# Patient Record
Sex: Male | Born: 1995 | Race: Black or African American | Hispanic: No | Marital: Single | Smoking: Current every day smoker
Health system: Southern US, Community
[De-identification: ages and names within clinical notes are randomized; demographics above are authoritative.]

## PROBLEM LIST (undated history)

## (undated) DIAGNOSIS — J45909 Unspecified asthma, uncomplicated: Secondary | ICD-10-CM

## (undated) DIAGNOSIS — F909 Attention-deficit hyperactivity disorder, unspecified type: Secondary | ICD-10-CM

## (undated) HISTORY — DX: Unspecified asthma, uncomplicated: J45.909

## (undated) HISTORY — DX: Attention-deficit hyperactivity disorder, unspecified type: F90.9

## (undated) HISTORY — PX: INGUINAL HERNIA REPAIR: SUR1180

---
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
## 2017-09-20 ENCOUNTER — Emergency Department (HOSPITAL_COMMUNITY): Payer: Managed Care, Other (non HMO)

## 2017-09-20 ENCOUNTER — Encounter (HOSPITAL_COMMUNITY): Payer: Self-pay | Admitting: Emergency Medicine

## 2017-09-20 ENCOUNTER — Other Ambulatory Visit: Payer: Self-pay

## 2017-09-20 ENCOUNTER — Emergency Department (HOSPITAL_COMMUNITY)
Admission: EM | Admit: 2017-09-20 | Discharge: 2017-09-20 | Disposition: A | Discharge status: Home / Self Care | Payer: Managed Care, Other (non HMO) | Attending: Emergency Medicine | Admitting: Emergency Medicine

## 2017-09-20 DIAGNOSIS — M79605 Pain in left leg: Secondary | ICD-10-CM | POA: Insufficient documentation

## 2017-09-20 DIAGNOSIS — J45909 Unspecified asthma, uncomplicated: Secondary | ICD-10-CM | POA: Insufficient documentation

## 2017-09-20 DIAGNOSIS — F17200 Nicotine dependence, unspecified, uncomplicated: Secondary | ICD-10-CM | POA: Diagnosis not present

## 2017-09-20 DIAGNOSIS — M549 Dorsalgia, unspecified: Secondary | ICD-10-CM | POA: Insufficient documentation

## 2017-09-20 DIAGNOSIS — M79604 Pain in right leg: Secondary | ICD-10-CM | POA: Insufficient documentation

## 2017-09-20 DIAGNOSIS — M7918 Myalgia, other site: Secondary | ICD-10-CM

## 2017-09-20 MED ORDER — METHOCARBAMOL 500 MG PO TABS: 500.0000 mg | ORAL_TABLET | Freq: Three times a day (TID) | ORAL | 0 refills | Status: DC | PRN

## 2017-09-20 MED ORDER — METHOCARBAMOL 500 MG PO TABS
500.0000 mg | ORAL_TABLET | Freq: Three times a day (TID) | ORAL | 0 refills | Status: AC | PRN
Start: 2017-09-20 — End: ?

## 2017-09-20 MED ORDER — NAPROXEN 500 MG PO TABS: 500.0000 mg | ORAL_TABLET | Freq: Two times a day (BID) | ORAL | 0 refills | Status: DC | PRN

## 2017-09-20 MED ORDER — NAPROXEN 250 MG PO TABS
500.0000 mg | ORAL_TABLET | Freq: Once | ORAL | Status: AC
  Administered 2017-09-20: 500 mg via ORAL
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
## 2017-09-20 NOTE — ED Notes (Signed)
Patient transported to X-ray 

## 2017-09-20 NOTE — Discharge Instructions (Addendum)
It is normal for your symptoms to worsen over the next 2 to 3 days.  Alternate ice and heat to areas of injury 3-4 times per day to limit inflammation and spasm.  Avoid strenuous activity and heavy lifting.  We recommend consistent use of naproxen in addition to Robaxin for muscle spasms.  Do not drive or drink alcohol after taking Robaxin as it may make you drowsy and impair your judgment.  We recommend follow-up with a primary care doctor to ensure resolution of symptoms.  Return to the ED for any new or concerning symptoms.

## 2018-10-23 ENCOUNTER — Other Ambulatory Visit: Payer: Self-pay

## 2018-10-23 ENCOUNTER — Emergency Department (HOSPITAL_BASED_OUTPATIENT_CLINIC_OR_DEPARTMENT_OTHER): Payer: Self-pay

## 2018-10-23 ENCOUNTER — Emergency Department (HOSPITAL_BASED_OUTPATIENT_CLINIC_OR_DEPARTMENT_OTHER)
Admission: EM | Admit: 2018-10-23 | Discharge: 2018-10-23 | Disposition: A | Discharge status: Home / Self Care | Payer: Self-pay | Attending: Emergency Medicine | Admitting: Emergency Medicine

## 2018-10-23 ENCOUNTER — Encounter (HOSPITAL_BASED_OUTPATIENT_CLINIC_OR_DEPARTMENT_OTHER): Payer: Self-pay | Admitting: *Deleted

## 2018-10-23 DIAGNOSIS — S0511XA Contusion of eyeball and orbital tissues, right eye, initial encounter: Secondary | ICD-10-CM | POA: Insufficient documentation

## 2018-10-23 DIAGNOSIS — W1789XA Other fall from one level to another, initial encounter: Secondary | ICD-10-CM | POA: Insufficient documentation

## 2018-10-23 DIAGNOSIS — S022XXA Fracture of nasal bones, initial encounter for closed fracture: Secondary | ICD-10-CM | POA: Insufficient documentation

## 2018-10-23 DIAGNOSIS — Y999 Unspecified external cause status: Secondary | ICD-10-CM | POA: Insufficient documentation

## 2018-10-23 DIAGNOSIS — H1131 Conjunctival hemorrhage, right eye: Secondary | ICD-10-CM

## 2018-10-23 DIAGNOSIS — Y939 Activity, unspecified: Secondary | ICD-10-CM | POA: Insufficient documentation

## 2018-10-23 DIAGNOSIS — F17210 Nicotine dependence, cigarettes, uncomplicated: Secondary | ICD-10-CM | POA: Insufficient documentation

## 2018-10-23 DIAGNOSIS — Y929 Unspecified place or not applicable: Secondary | ICD-10-CM | POA: Insufficient documentation

## 2018-10-23 DIAGNOSIS — S93401A Sprain of unspecified ligament of right ankle, initial encounter: Secondary | ICD-10-CM | POA: Insufficient documentation

## 2018-10-23 DIAGNOSIS — J45909 Unspecified asthma, uncomplicated: Secondary | ICD-10-CM | POA: Insufficient documentation

## 2018-10-23 MED ORDER — NAPROXEN 500 MG PO TABS: 500.0000 mg | ORAL_TABLET | Freq: Two times a day (BID) | ORAL | 0 refills | Status: AC

## 2018-10-23 MED ORDER — OXYCODONE-ACETAMINOPHEN 5-325 MG PO TABS
1.0000 | ORAL_TABLET | Freq: Once | ORAL | Status: AC
  Administered 2018-10-23: 02:00:00 1 via ORAL
  Filled 2018-10-23: qty 1
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
## 2018-10-23 NOTE — ED Notes (Signed)
Ice pack given

## 2018-10-23 NOTE — ED Notes (Signed)
Taken to radiology at this time. 

## 2018-10-23 NOTE — Discharge Instructions (Addendum)
Your seen today after a fall.  Found to have a contusion to the right, ankle sprain, and nasal bone fracture.  Keep leg iced and elevated.  Take naproxen as needed for pain.  Ice to the right eye.
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
